# Patient Record
Sex: Male | Born: 1965 | Race: White | Hispanic: No | Marital: Married | Smoking: Never smoker
Health system: Southern US, Community
[De-identification: ages and names within clinical notes are randomized; demographics above are authoritative.]

## PROBLEM LIST (undated history)

## (undated) DIAGNOSIS — Z800 Family history of malignant neoplasm of digestive organs: Secondary | ICD-10-CM

## (undated) HISTORY — PX: APPENDECTOMY: SHX54

## (undated) HISTORY — PX: HEMORRHOID SURGERY: SHX153

## (undated) HISTORY — DX: Family history of malignant neoplasm of digestive organs: Z80.0

---

## 2016-07-12 ENCOUNTER — Emergency Department (INDEPENDENT_AMBULATORY_CARE_PROVIDER_SITE_OTHER): Payer: 59

## 2016-07-12 ENCOUNTER — Emergency Department
Admission: EM | Admit: 2016-07-12 | Discharge: 2016-07-12 | Disposition: A | Discharge status: Home / Self Care | Payer: 59 | Source: Home / Self Care | Attending: Family Medicine | Admitting: Family Medicine

## 2016-07-12 ENCOUNTER — Encounter: Payer: Self-pay | Admitting: Emergency Medicine

## 2016-07-12 DIAGNOSIS — S63501A Unspecified sprain of right wrist, initial encounter: Secondary | ICD-10-CM

## 2016-07-12 DIAGNOSIS — W009XXA Unspecified fall due to ice and snow, initial encounter: Secondary | ICD-10-CM | POA: Diagnosis not present

## 2016-07-12 DIAGNOSIS — S6991XA Unspecified injury of right wrist, hand and finger(s), initial encounter: Secondary | ICD-10-CM | POA: Diagnosis not present

## 2016-07-12 NOTE — Discharge Instructions (Signed)
Wear wrist splint for about 7 to 10 days until improved. Apply ice pack for 20 to 30 minutes, 3 to 4 times daily  Continue until pain and swelling decrease.  Begin Ibuprofen 200mg , 4 tabs every 8 hours with food.  Begin range of motion and stretching exercises as tolerated.

## 2016-07-12 NOTE — ED Triage Notes (Signed)
Pt fell today on some ice, having right hand/wrist pain from fall.

## 2016-07-12 NOTE — ED Provider Notes (Signed)
[facility]    CSN: [id number] Arrival date & time: 07/12/16  1340     History   Chief Complaint Chief Complaint  Patient presents with  . Hand Injury    HPI Richard English is a 51 y.o. male.   Patient slipped on ice in a parking lot one hour ago, bracing himself with his right hand.  He has had persistent pain in his right wrist.   The history is provided by the patient.  Wrist Pain  This is a new problem. The current episode started 1 to 2 hours ago. The problem occurs constantly. The problem has not changed since onset.Associated symptoms comments: none. Exacerbated by: flexion and extension of right wrist. Nothing relieves the symptoms. He has tried nothing for the symptoms.    History reviewed. No pertinent past medical history.  There are no active problems to display for this patient.   Past Surgical History:  Procedure Laterality Date  . APPENDECTOMY         Home Medications    Prior to Admission medications   Not on File    Family History Family History  Problem Relation Age of Onset  . Hypertension Mother     Social History Social History  Substance Use Topics  . Smoking status: Never Smoker  . Smokeless tobacco: Never Used  . Alcohol use Yes     Comment: 4 drinks weekly     Allergies   Sulfa antibiotics   Review of Systems Review of Systems  All other systems reviewed and are negative.    Physical Exam Triage Vital Signs ED Triage Vitals  Enc Vitals Group     BP 07/12/16 1402 138/84     Pulse Rate 07/12/16 1402 83     Resp --      Temp 07/12/16 1402 98.3 F (36.8 C)     Temp Source 07/12/16 1402 Oral     SpO2 07/12/16 1402 98 %     Weight 07/12/16 1402 191 lb 8 oz (86.9 kg)     Height 07/12/16 1402 6' (1.829 m)     Head Circumference --      Peak Flow --      Pain Score 07/12/16 1407 1     Pain Loc --      Pain Edu? --      Excl. in GC? --    No data found.   Updated Vital Signs BP 138/84 (BP  Location: Left Arm)   Pulse 83   Temp 98.3 F (36.8 C) (Oral)   Ht 6' (1.829 m)   Wt 191 lb 8 oz (86.9 kg)   SpO2 98%   BMI 25.97 kg/m   Visual Acuity Right Eye Distance:   Left Eye Distance:   Bilateral Distance:    Right Eye Near:   Left Eye Near:    Bilateral Near:     Physical Exam  Constitutional: He appears well-developed.  HENT:  Head: Atraumatic.  Right Ear: External ear normal.  Left Ear: External ear normal.  Nose: Nose normal.  Mouth/Throat: Oropharynx is clear and moist.  Eyes: Conjunctivae are normal. Pupils are equal, round, and reactive to light.  Neck: Normal range of motion.  Cardiovascular: Normal heart sounds.   Pulmonary/Chest: Breath sounds normal.  Abdominal: There is no tenderness.  Musculoskeletal:       Right wrist: He exhibits decreased range of motion, tenderness and bony tenderness.       Hands: Right wrist has tenderness  to palpation over the ulnar aspect as noted on diagram.   Neurological: He is alert.  Skin: Skin is warm and dry.  Nursing note and vitals reviewed.    UC Treatments / Results  Labs (all labs ordered are listed, but only abnormal results are displayed) Labs Reviewed - No data to display  EKG  EKG Interpretation None       Radiology Dg Wrist Complete Right  Result Date: 07/12/2016 CLINICAL DATA:  Fall on ice today with right hand and wrist injury. Pain localizing to region of fifth metacarpal radiating to wrist. Initial encounter. EXAM: RIGHT WRIST - COMPLETE 3+ VIEW COMPARISON:  None. FINDINGS: There is no evidence of fracture or dislocation. There is no evidence of arthropathy or other focal bone abnormality. Soft tissues are unremarkable. IMPRESSION: Negative. Electronically Signed   By: Irish LackGlenn  Yamagata M.D.   On: 07/12/2016 14:32   Dg Hand Complete Right  Result Date: 07/12/2016 CLINICAL DATA:  Fall on ice today with right hand and wrist injury. Pain localizing to region of fifth metacarpal radiating to  wrist. Initial encounter. EXAM: RIGHT HAND - COMPLETE 3+ VIEW COMPARISON:  None. FINDINGS: There is no evidence of fracture or dislocation. There is no evidence of arthropathy or other focal bone abnormality. Soft tissues are unremarkable. IMPRESSION: Negative. Electronically Signed   By: Irish LackGlenn  Yamagata M.D.   On: 07/12/2016 14:31    Procedures Procedures (including critical care time)  Medications Ordered in UC Medications - No data to display   Initial Impression / Assessment and Plan / UC Course  I have reviewed the triage vital signs and the nursing notes.  Pertinent labs & imaging results that were available during my care of the patient were reviewed by me and considered in my medical decision making (see chart for details).    Suspect TFCC injury.  Wrist splint applied. Wear wrist splint for about 7 to 10 days until improved. Apply ice pack for 20 to 30 minutes, 3 to 4 times daily  Continue until pain and swelling decrease.  Begin Ibuprofen 200mg , 4 tabs every 8 hours with food.  Begin range of motion and stretching exercises as tolerated. Followup with Dr. Rodney Langtonhomas Thekkekandam or Dr. Clementeen GrahamEvan Corey ([facility]) if not improving about two weeks.     Final Clinical Impressions(s) / UC Diagnoses   Final diagnoses:  Sprain of right wrist, initial encounter    New Prescriptions New Prescriptions   No medications on file     Lattie HawStephen A Jentzen Minasyan, MD 07/19/16 1918

## 2016-08-12 ENCOUNTER — Encounter: Payer: Self-pay | Admitting: Family Medicine

## 2016-08-12 ENCOUNTER — Ambulatory Visit (INDEPENDENT_AMBULATORY_CARE_PROVIDER_SITE_OTHER): Payer: 59 | Admitting: Family Medicine

## 2016-08-12 ENCOUNTER — Ambulatory Visit (INDEPENDENT_AMBULATORY_CARE_PROVIDER_SITE_OTHER): Payer: 59

## 2016-08-12 VITALS — BP 125/77 | HR 79 | Wt 195.0 lb

## 2016-08-12 DIAGNOSIS — M7042 Prepatellar bursitis, left knee: Secondary | ICD-10-CM

## 2016-08-12 DIAGNOSIS — M25562 Pain in left knee: Secondary | ICD-10-CM

## 2016-08-12 DIAGNOSIS — G8929 Other chronic pain: Secondary | ICD-10-CM | POA: Diagnosis not present

## 2016-08-12 DIAGNOSIS — S8992XA Unspecified injury of left lower leg, initial encounter: Secondary | ICD-10-CM | POA: Diagnosis not present

## 2016-08-12 DIAGNOSIS — M7040 Prepatellar bursitis, unspecified knee: Secondary | ICD-10-CM | POA: Insufficient documentation

## 2016-08-12 MED ORDER — DICLOFENAC SODIUM 1 % TD GEL: 4.0000 g | Freq: Four times a day (QID) | TRANSDERMAL | 11 refills | Status: AC

## 2016-08-12 NOTE — Progress Notes (Signed)
   Subjective:    I'm seeing this patient as a consultation for:  Rosalio MacadamiaHELMAN,STEVEN, MD   CC: Knee Pain  HPI: Jeb LeveringDavid Fell and landed on his left anterior knee about a month ago. He slipped and fell on the ice. He noted some pain and bruising initially which is mostly resolved. He was essentially pain-free except for when he kneels or puts pressure on the anterior knee. The pain can be quite severe at times. He denies any radiating pain weakness or numbness fevers or chills. He has not tried any medications aside from intermittent ibuprofen which helps some.  Past medical history, Surgical history, Family history not pertinant except as noted below, Social history, Allergies, and medications have been entered into the medical record, reviewed, and no changes needed.   Review of Systems: No headache, visual changes, nausea, vomiting, diarrhea, constipation, dizziness, abdominal pain, skin rash, fevers, chills, night sweats, weight loss, swollen lymph nodes, body aches, joint swelling, muscle aches, chest pain, shortness of breath, mood changes, visual or auditory hallucinations.   Objective:    Vitals:   08/12/16 1350  BP: 125/77  Pulse: 79   General: Well Developed, well nourished, and in no acute distress.  Neuro/Psych: Alert and oriented x3, extra-ocular muscles intact, able to move all 4 extremities, sensation grossly intact. Skin: Warm and dry, no rashes noted.  Respiratory: Not using accessory muscles, speaking in full sentences, trachea midline.  Cardiovascular: Pulses palpable, no extremity edema. Abdomen: Does not appear distended. MSK: Left knee: Unremarkable appearing without significant erythema or induration. He is tender to palpation at the inferior border of the patella. He is a palpable squeak in this area as well. He has pain-free and normal knee range of motion. He is intact extension and flexion strength. Stable ligamentous exam. Knee exam is otherwise unremarkable.  X-ray  left knee is normal-appearing awaiting formal radiology review  No results found for this or any previous visit (from the past 24 hour(s)). No results found.  Impression and Recommendations:    Assessment and Plan: 51 y.o. male with Posterior traumatic prepatellar bursitis of the left knee. Plan to treat conservatively with ice and diclofenac gel. Recheck in about a month if not better..   Discussed warning signs or symptoms. Please see discharge instructions. Patient expresses understanding.

## 2016-08-12 NOTE — Patient Instructions (Signed)
Thank you for coming in today. Apply the voltaren gel 4x daily.  You can also use up to 2 aleve twice daily for pain.  If you take aleve for more than a few day in a row take with pepcid, zantac or Prilosec to prevent ulcers.  The equivalent  dose of advil is 3-4 pills every 8 hours.    Prepatellar Bursitis Prepatellar bursitis is inflammation of the prepatellar bursa. The prepatellar bursa is a fluid-filled sac that cushions the kneecap (patella). Prepatellar bursitis happens when fluid builds up in the this sac and causes it to get larger. The condition causes knee pain. What are the causes? This condition may be caused by:  Constant pressure on the knees from kneeling.  A hit to the knee.  Falling on the knee.  A bacterial infection.  Moving the knee often in a forceful way. What increases the risk? This condition is more likely to develop in:  People who play a sport that involves a risk for falls on the knee or hard hits (blows) to the knee. These sports include:  Football.  Wrestling.  Basketball.  Soccer.  People who have to kneel for long periods of time, such as roofers, plumbers, and gardeners.  People with another inflammatory condition, such as gout or rheumatoid arthritis. What are the signs or symptoms? The most common symptom of this condition is knee pain that gets better with rest. Other symptoms include:  Swelling on the front of the kneecap.  Warmth in the knee.  Tenderness with activity.  Redness in the knee.  Inability to bend the knee or to kneel. How is this diagnosed? This condition may be diagnosed based on:  Your symptoms.  Your medical history.  A physical exam. During the exam, your provider will compare your knees and check for tenderness and pain when moving your knee. Your health care provider may also use a needle to remove fluid from the bursa to help diagnose an infection.  Tests, such as:  A blood test that checks for  infection.  X-rays. These may be taken to check the structure of the patella.  MRI or ultrasound. These may be done to check for swelling and fluid buildup in the bursa. How is this treated? This condition may be treated by:  Resting the knee.  Applying ice to the knee.  Medicine, such as:  Nonsteroidal anti-inflammatory drugs (NSAIDs). These can help to reduce pain and swelling.  Antibiotic medicines. These may be needed if you have an infection.  Steroid medicines. These may be prescribed if other treatments are not helping.  Raising (elevating) the knee while resting.  Doing strengthening and stretching exercises (physical therapy). These may be recommended after pain and swelling improve.  Having a procedure to remove fluid from the bursa. This may be done if other treatment is not helping.  Having surgery to remove the bursa. This may be done if you have a severe infection or if the condition keeps coming back after treatment. Follow these instructions at home: Medicines  Take over-the-counter and prescription medicines only as told by your health care provider.  If you were prescribed an antibiotic medicine, take it as told by your health care provider. Do not stop taking the antibiotic even if you start to feel better. Managing pain, stiffness, and swelling  If directed, apply ice to your knee.  Put ice in a plastic bag.  Place a towel between your skin and the bag.  Leave the ice  on for 20 minutes, 2-3 times a day.  Elevate your knee above the level of your heart while you are sitting or lying down. Driving  Do not drive or operate heavy machinery while taking prescription pain medicine.  Ask your health care provider when it is safe fpr you to drive. Activity  Rest your knee.  Avoid activities that cause pain.  Return to your normal activities as told by your health care provider. Ask your health care provider what activities are safe for you.  Do  exercises as told by your health care provider. General instructions  Do not use the injured limb to support your body weight until your health care provider says that you can.  Do not use any tobacco products, such as cigarettes, chewing tobacco, and e-cigarettes. Tobacco can delay bone healing. If you need help quitting, ask your health care provider.  Keep all follow-up visits as told by your health care provider. This is important. How is this prevented?  Warm up and stretch before being active.  Cool down and stretch after being active.  Give your body time to rest between periods of activity.  Make sure to use equipment that fits you.  Be safe and responsible while being active to avoid falls.  Do at least 150 minutes of moderate-intensity exercise each week, such as brisk walking or water aerobics.  Maintain physical fitness, including:  Strength.  Flexibility.  Cardiovascular fitness.  Endurance. Contact a health care provider if:  Your symptoms do not improve.  Your symptoms get worse.  Your symptoms keep coming back after treatment.  You develop a fever and have warmth, redness, and swelling over your knee. This information is not intended to replace advice given to you by your health care provider. Make sure you discuss any questions you have with your health care provider. Document Released: 06/09/2005 Document Revised: 02/12/2016 Document Reviewed: 03/09/2015 Elsevier Interactive Patient Education  2017 [employer]    Prepatellar Bursitis Rehab Ask your health care provider which exercises are safe for you. Do exercises exactly as told by your health care provider and adjust them as directed. It is normal to feel mild stretching, pulling, tightness, or discomfort as you do these exercises, but you should stop right away if you feel sudden pain or your pain gets worse.Do not begin these exercises until told by your health care provider. Stretching and  range of motion exercises These exercises warm up your muscles and joints and improve the movement and flexibility of your knee. These exercises also help to relieve pain, numbness, and tingling. Exercise A: Hamstring, standing 1. Stand with your __________ foot resting on a chair. Your __________ leg should be fully extended. 2. Arch your lower back slightly. 3. Leading with your chest, lean forward at the waist until you feel a gentle stretch in the back of your __________ knee or in your thigh. You should not need to lean far to feel a stretch. 4. Hold this position for __________ seconds. Repeat __________ times. Complete this stretch __________ times a day. Exercise B: Knee flexion, active heel slides 1. Lie on your back with both knees straight. If this causes back discomfort, bend your __________ knee so your foot is flat on the floor. 2. Slowly slide your __________ heel back toward your buttocks until you feel a gentle stretch in the front of your knee or thigh. 3. Hold this position for __________ seconds. 4. Slowly slide your __________ heel back to the starting position.  Repeat __________ times. Complete this stretch __________ times a day. Strengthening exercises These exercises build strength and endurance in your knee. Endurance is the ability to use your muscles for a long time, even after they get tired. Exercise C: Quadriceps, isometric 1. Lie on your back with your __________ leg extended and your __________ knee bent. 2. Slowly tense the muscles in the front of your __________ thigh. When you do this, you should see your kneecap slide up toward your hip or see increased dimpling just above the knee. This motion will push the back of your knee down toward the surface that is under it. 3. For __________ seconds, keep the muscle as tight as you can without increasing your pain. 4. Relax the muscles slowly and completely. Repeat __________ times. Complete this exercise __________  times a day. Exercise D: Straight leg raises (quadriceps) 1. Lie on your back with your __________ leg extended and your __________ knee bent. 2. Slowly tense the muscles in your __________ thigh. When you do this, you should see your kneecap slide up toward your hip or see increased dimpling just above the knee. 3. Keep these muscles tight as you raise your leg 4-6 inches (10-15 cm) off the floor. 4. Hold this position for __________ seconds. 5. Keep these muscles tense as you lower your leg slowly. 6. Relax your muscles slowly and completely. Repeat __________ times. Complete this exercise __________ times a day. Exercise E: Straight leg raises (hip extensors) 1. Lie on your abdomen on a bed or a firm surface. You can put a pillow under your hips if that is more comfortable. 2. Tense your buttock muscles and lift your __________ thigh. Your __________ knee can be bent or straight, but do not let your back arch. 3. Hold this position for __________ seconds. 4. Slowly lower your leg to the starting position. 5. Let your muscles relax completely. Repeat __________ times. Complete this exercise __________ times a day. This information is not intended to replace advice given to you by your health care provider. Make sure you discuss any questions you have with your health care provider. Document Released: 06/09/2005 Document Revised: 02/12/2016 Document Reviewed: 03/09/2015 Elsevier Interactive Patient Education  2017 [employer]

## 2016-08-14 NOTE — Progress Notes (Signed)
Note sent to pcp via epic.  

## 2016-09-05 ENCOUNTER — Encounter: Payer: Self-pay | Admitting: *Deleted

## 2016-09-05 ENCOUNTER — Emergency Department
Admission: EM | Admit: 2016-09-05 | Discharge: 2016-09-05 | Disposition: A | Discharge status: Home / Self Care | Payer: 59 | Source: Home / Self Care | Attending: Family Medicine | Admitting: Family Medicine

## 2016-09-05 DIAGNOSIS — J069 Acute upper respiratory infection, unspecified: Secondary | ICD-10-CM | POA: Diagnosis not present

## 2016-09-05 DIAGNOSIS — B9789 Other viral agents as the cause of diseases classified elsewhere: Secondary | ICD-10-CM | POA: Diagnosis not present

## 2016-09-05 MED ORDER — AZITHROMYCIN 250 MG PO TABS: ORAL_TABLET | ORAL | 0 refills | Status: AC

## 2016-09-05 MED ORDER — PREDNISONE 20 MG PO TABS: ORAL_TABLET | ORAL | 0 refills | Status: AC

## 2016-09-05 NOTE — Discharge Instructions (Signed)
Take plain guaifenesin (1200mg extended release tabs such as Mucinex) twice daily, with plenty of water, for cough and congestion.  May add Pseudoephedrine (30mg, one or two every 4 to 6 hours) for sinus congestion.  Get adequate rest.   May use Afrin nasal spray (or generic oxymetazoline) each morning for about 5 days and then discontinue.  Also recommend using saline nasal spray several times daily and saline nasal irrigation (AYR is a common brand).  Use Flonase nasal spray each morning after using Afrin nasal spray and saline nasal irrigation. Try warm salt water gargles for sore throat.  Stop all antihistamines for now, and other non-prescription cough/cold preparations. May take Delsym Cough Suppressant at bedtime for nighttime cough.  

## 2016-09-05 NOTE — ED Provider Notes (Signed)
[facility]    CSN: [id number] Arrival date & time: 09/05/16  1050     History   Chief Complaint Chief Complaint  Patient presents with  . Sinus Problem  . Nasal Congestion    HPI Richard English is a 51 y.o. male.   Patient developed mild cold-like symptoms about 12 days ago with sinus congestion, low grade fever, and scratchy throat.  Five days ago he developed tightness in his anterior chest, followed by a cough two days ago.  He has a history of seasonal rhinitis.                                                                                                                                                                                          The history is provided by the patient.    History reviewed. No pertinent past medical history.  Patient Active Problem List   Diagnosis Date Noted  . Prepatellar bursitis 08/12/2016    Past Surgical History:  Procedure Laterality Date  . APPENDECTOMY         Home Medications    Prior to Admission medications   Medication Sig Start Date End Date Taking? Authorizing Provider  azithromycin (ZITHROMAX Z-PAK) 250 MG tablet Take 2 tabs today; then begin one tab once daily for 4 more days. 09/05/16   Lattie Haw, MD  diclofenac sodium (VOLTAREN) 1 % GEL Apply 4 g topically 4 (four) times daily. To affected joint. 08/12/16   Rodolph Bong, MD  EPINEPHrine 0.3 mg/0.3 mL IJ SOAJ injection Inject into the muscle. 11/29/12   Historical Provider, MD  predniSONE (DELTASONE) 20 MG tablet Take one tab by mouth twice daily for 5 days, then one daily for 3 days. Take with food. 09/05/16   Lattie Haw, MD    Family History Family History  Problem Relation Age of Onset  . Hypertension Mother     Social History Social History  Substance Use Topics  . Smoking status: Never Smoker  . Smokeless tobacco: Never Used  . Alcohol use Yes     Comment: 4 drinks weekly     Allergies   Bee venom; Wasp venom; and Sulfa  antibiotics   Review of Systems Review of Systems + sore throat + cough No pleuritic pain No wheezing + nasal congestion + post-nasal drainage No sinus pain/pressure No itchy/red eyes ? earache No hemoptysis No SOB No fever, + chills No nausea No vomiting No abdominal pain No diarrhea No urinary symptoms No skin rash + fatigue No myalgias + headache Used OTC meds without relief   Physical Exam Triage Vital Signs ED Triage  Vitals  Enc Vitals Group     BP 09/05/16 1121 126/84     Pulse Rate 09/05/16 1121 (!) 104     Resp --      Temp 09/05/16 1121 99.1 F (37.3 C)     Temp Source 09/05/16 1121 Oral     SpO2 09/05/16 1121 98 %     Weight --      Height --      Head Circumference --      Peak Flow --      Pain Score 09/05/16 1122 3     Pain Loc --      Pain Edu? --      Excl. in GC? --    No data found.   Updated Vital Signs BP 126/84 (BP Location: Left Arm)   Pulse (!) 104   Temp 99.1 F (37.3 C) (Oral)   SpO2 98%   Visual Acuity Right Eye Distance:   Left Eye Distance:   Bilateral Distance:    Right Eye Near:   Left Eye Near:    Bilateral Near:     Physical Exam Nursing notes and Vital Signs reviewed. Appearance:  Patient appears stated age, and in no acute distress Eyes:  Pupils are equal, round, and reactive to light and accomodation.  Extraocular movement is intact.  Conjunctivae are not inflamed  Ears:  Canals normal.  Tympanic membranes normal.  Nose:  Congested turbinates.  No sinus tenderness.   Marland Kitchen  Pharynx:  Normal Neck:  Supple.  Tender enlarged posterior/lateral nodes are palpated bilaterally  Lungs:   Course breath sounds posteriorly.  Breath sounds are equal.  Moving air well. Heart:  Regular rate and rhythm without murmurs, rubs, or gallops.  Abdomen:  Nontender without masses or hepatosplenomegaly.  Bowel sounds are present.  No CVA or flank tenderness.  Extremities:  No edema.  Skin:  No rash present.    UC Treatments /  Results  Labs (all labs ordered are listed, but only abnormal results are displayed) Labs Reviewed - No data to display  EKG  EKG Interpretation None       Radiology No results found.  Procedures Procedures (including critical care time)  Medications Ordered in UC Medications - No data to display   Initial Impression / Assessment and Plan / UC Course  I have reviewed the triage vital signs and the nursing notes.  Pertinent labs & imaging results that were available during my care of the patient were reviewed by me and considered in my medical decision making (see chart for details).    Begin Z-pak for atypical coverage. With his history of seasonal rhinitis, begin prednisone burst/taper. Take plain guaifenesin (1200mg  extended release tabs such as Mucinex) twice daily, with plenty of water, for cough and congestion.  May add Pseudoephedrine (30mg , one or two every 4 to 6 hours) for sinus congestion.  Get adequate rest.   May use Afrin nasal spray (or generic oxymetazoline) each morning for about 5 days and then discontinue.  Also recommend using saline nasal spray several times daily and saline nasal irrigation (AYR is a common brand).  Use Flonase nasal spray each morning after using Afrin nasal spray and saline nasal irrigation. Try warm salt water gargles for sore throat.  Stop all antihistamines for now, and other non-prescription cough/cold preparations. May take Delsym Cough Suppressant at bedtime for nighttime cough.     Final Clinical Impressions(s) / UC Diagnoses   Final diagnoses:  Viral URI with  cough    New Prescriptions New Prescriptions   AZITHROMYCIN (ZITHROMAX Z-PAK) 250 MG TABLET    Take 2 tabs today; then begin one tab once daily for 4 more days.   PREDNISONE (DELTASONE) 20 MG TABLET    Take one tab by mouth twice daily for 5 days, then one daily for 3 days. Take with food.     Lattie HawStephen A Khamron Gellert, MD 09/05/16 1250

## 2016-09-05 NOTE — ED Triage Notes (Signed)
Patient reports having typical cold symptoms 1 1/2 weeks ago, they improved but now feels pressure behind his eyes, colored nasal drainage, ear pressure and low grade fever. Taken Dayquil and Alka seltzer cold otc.

## 2017-12-30 IMAGING — DX DG KNEE 1-2V*R*
3 series · 3 of 3 positions shown · non-contrast
Comparison: None.

CLINICAL DATA: Comparison right knee radiograph. Chronic left knee
pain after slipping on ice. Initial encounter.

EXAM:
RIGHT KNEE - 1-2 VIEW

[tunnel]
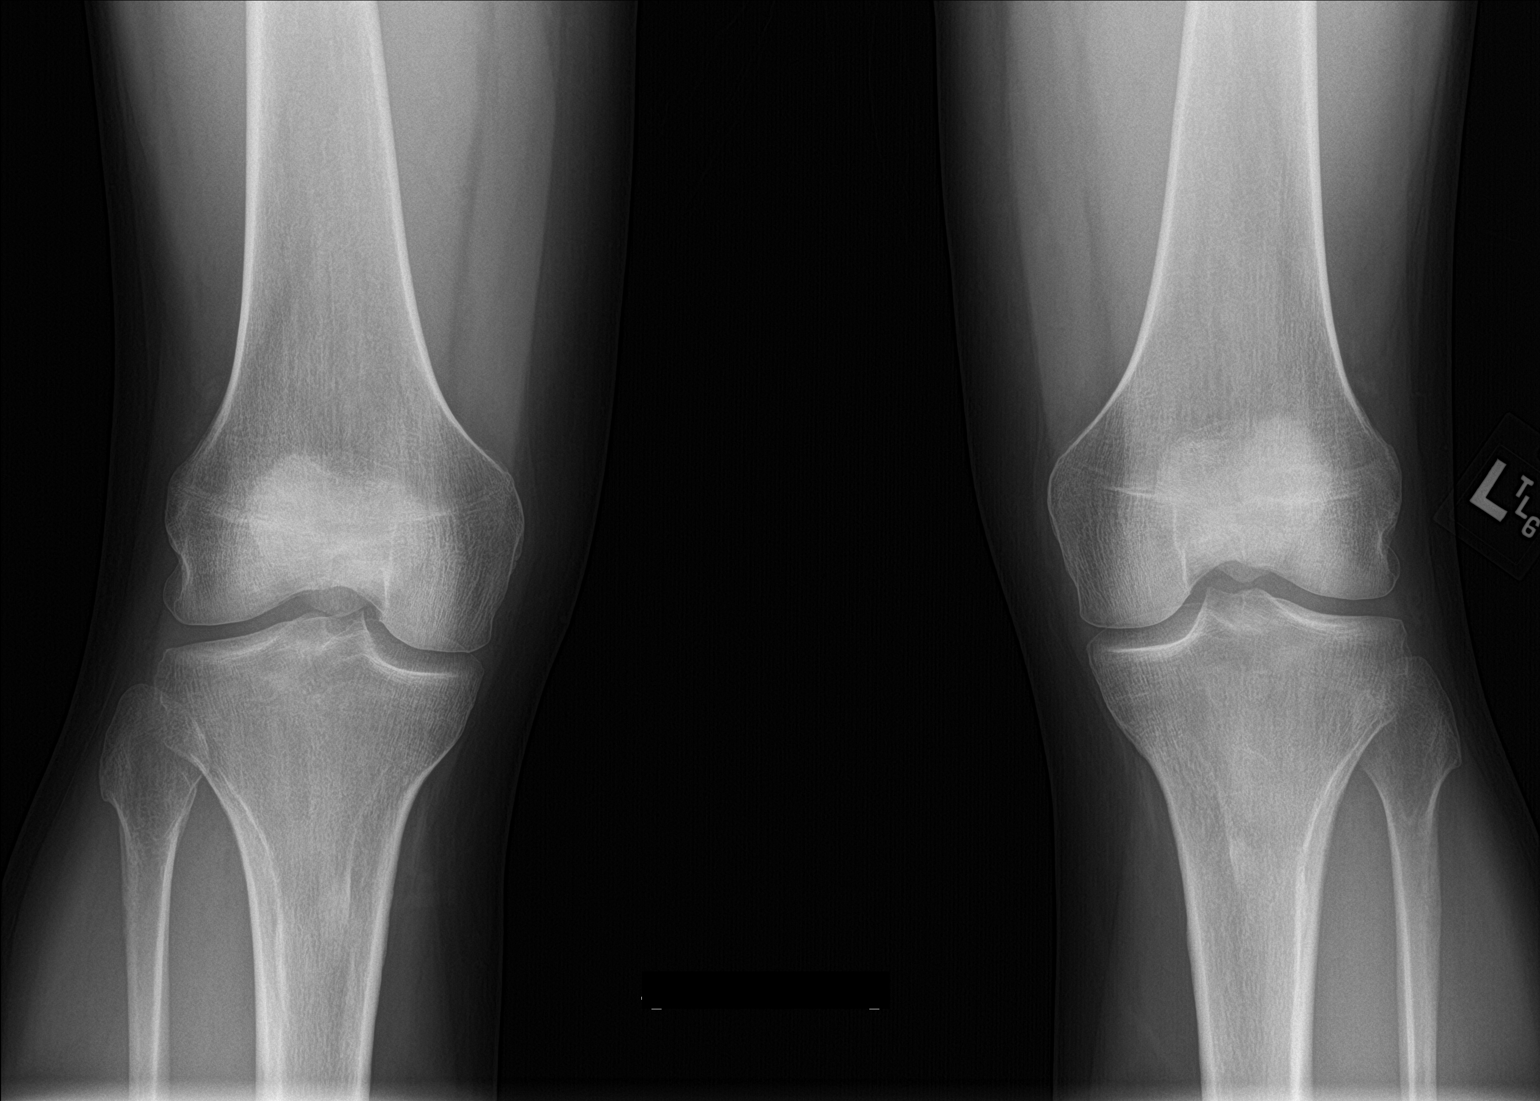

[knee lat]
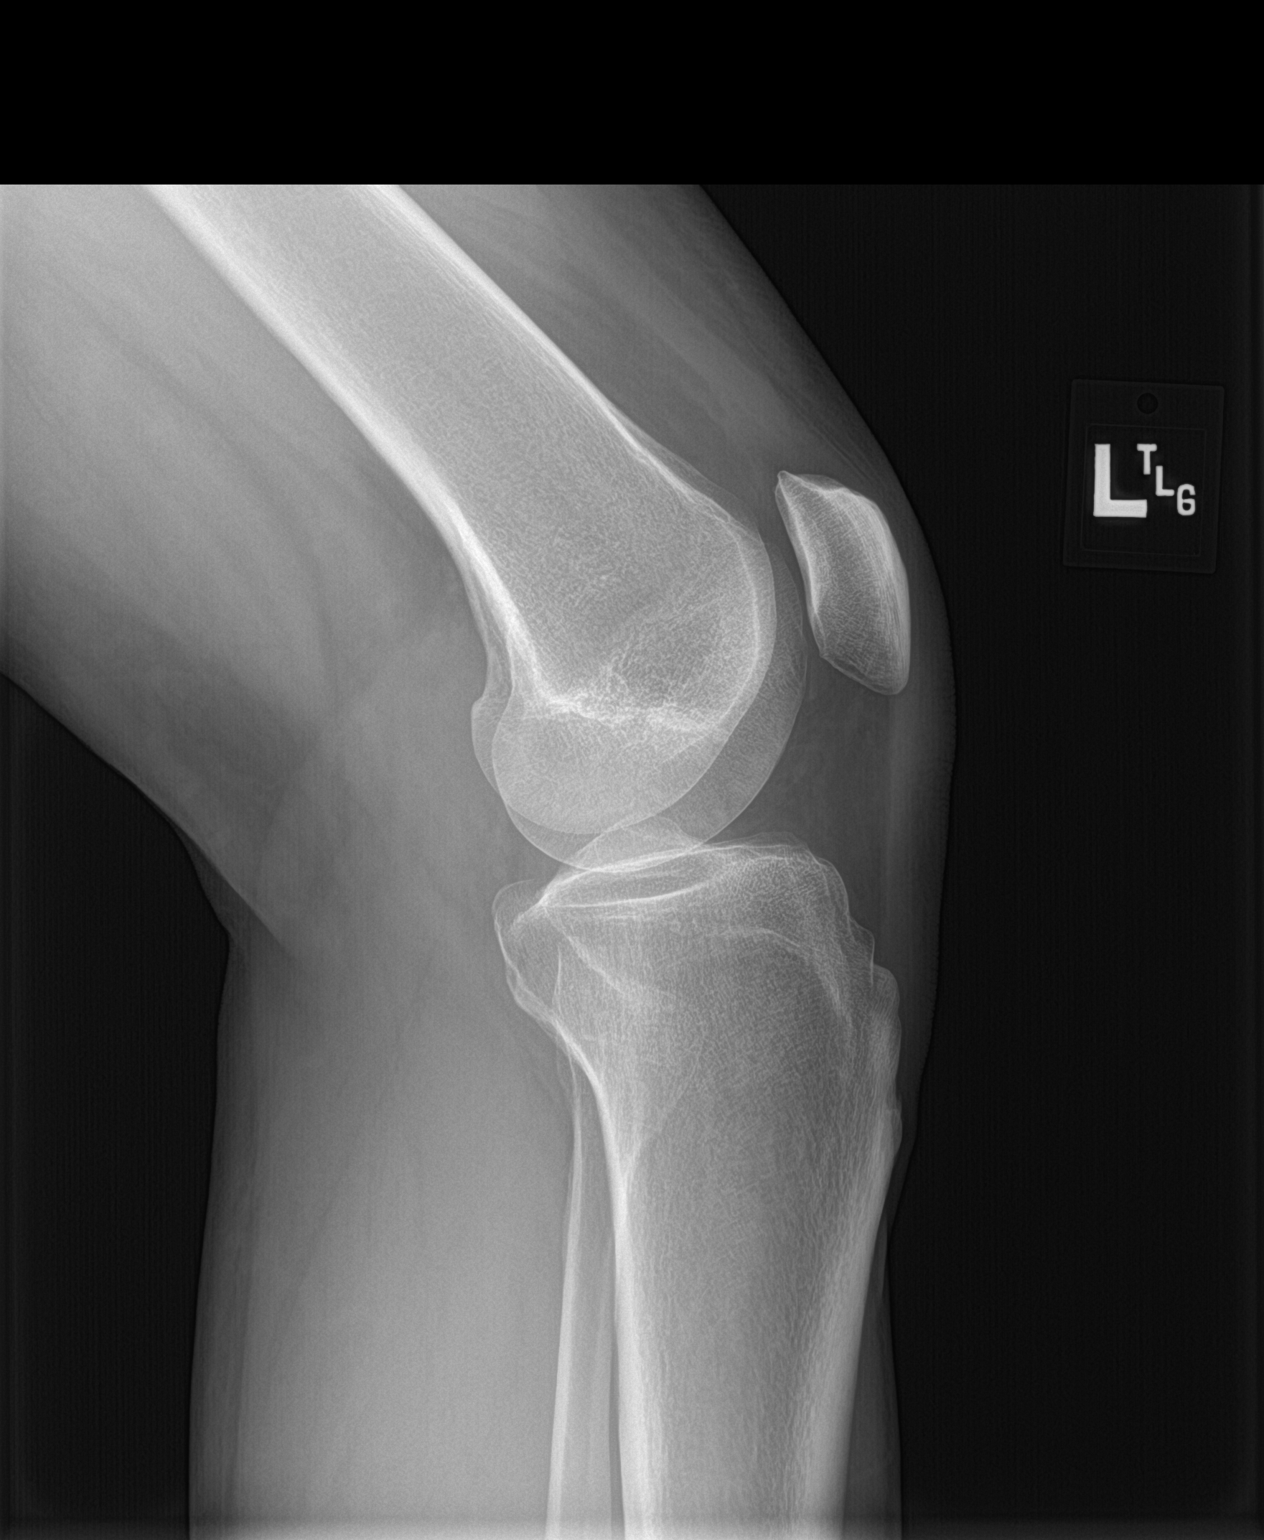

[knee sunrise]
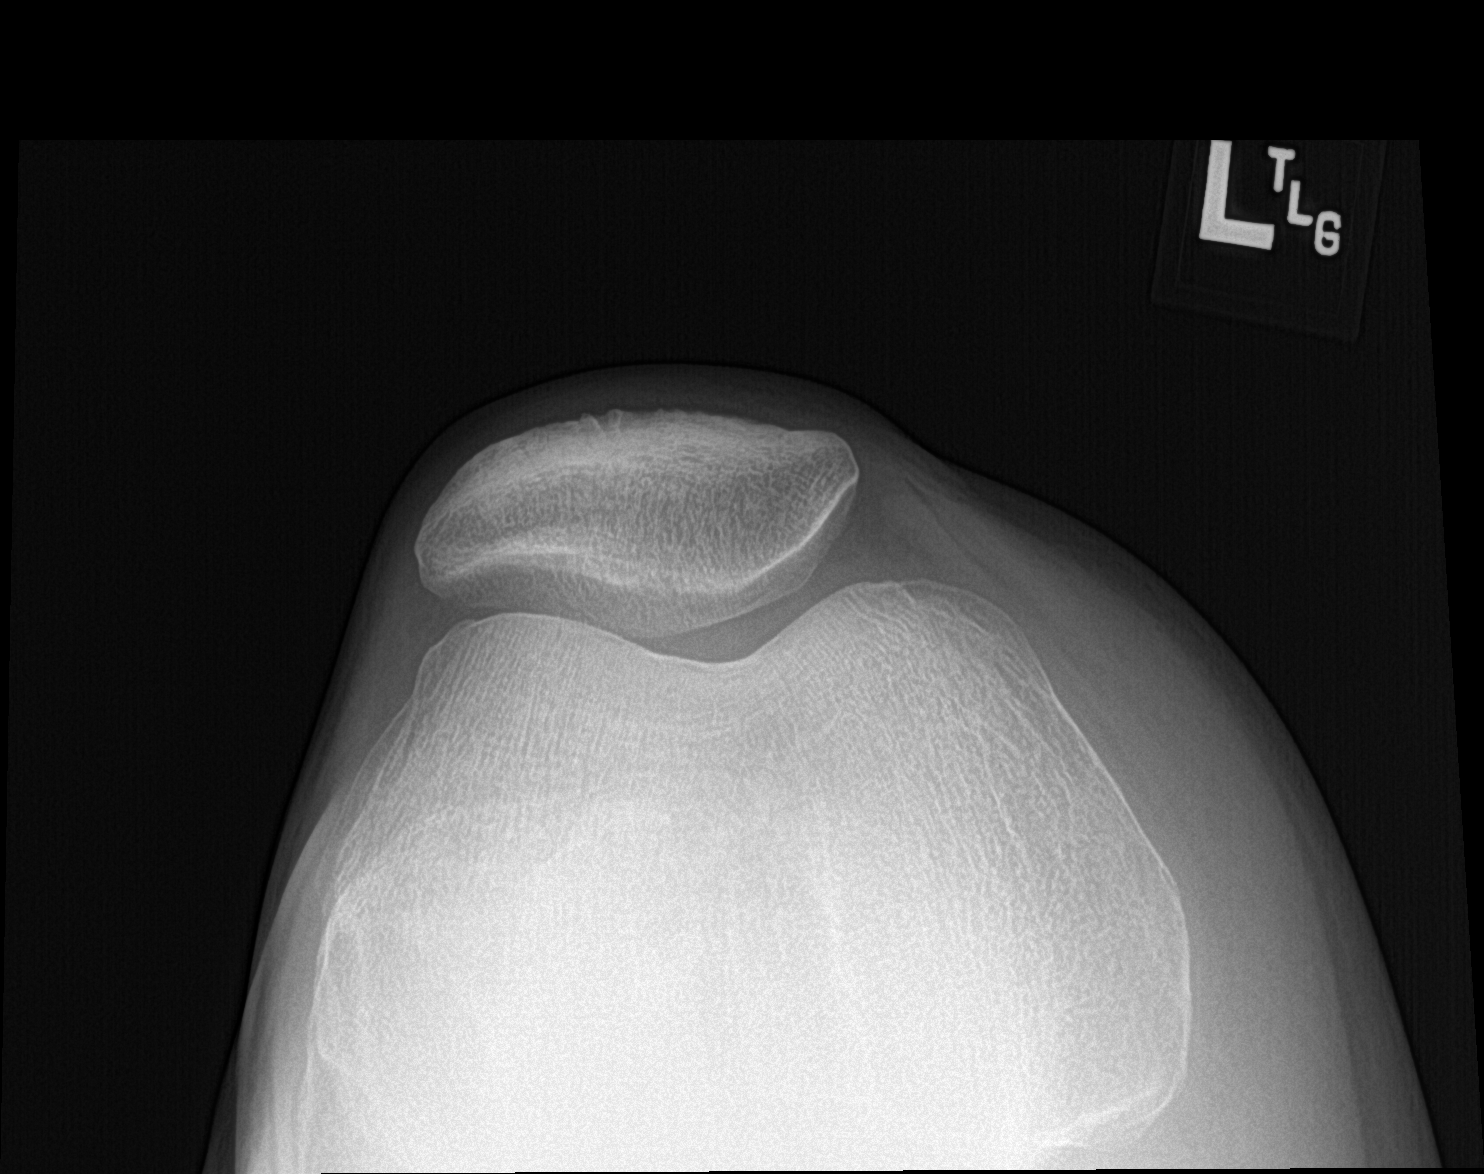

[3 of 3 positions shown; findings below may reference images not displayed]

FINDINGS: There is no evidence of fracture or dislocation. The joint spaces
are preserved. No significant degenerative change is seen; the
patellofemoral joint is grossly unremarkable in appearance.

The visualized soft tissues are normal in appearance.
IMPRESSION: No evidence of fracture or dislocation.

## 2024-04-11 ENCOUNTER — Inpatient Hospital Stay: Payer: Self-pay | Attending: Hematology

## 2024-04-11 ENCOUNTER — Inpatient Hospital Stay: Payer: Self-pay

## 2024-04-11 ENCOUNTER — Other Ambulatory Visit: Payer: Self-pay

## 2024-04-11 DIAGNOSIS — Z800 Family history of malignant neoplasm of digestive organs: Secondary | ICD-10-CM | POA: Diagnosis present

## 2024-04-11 DIAGNOSIS — Z1379 Encounter for other screening for genetic and chromosomal anomalies: Secondary | ICD-10-CM | POA: Diagnosis not present

## 2024-04-11 DIAGNOSIS — Z803 Family history of malignant neoplasm of breast: Secondary | ICD-10-CM | POA: Insufficient documentation

## 2024-04-11 LAB — GENETIC SCREENING ORDER

## 2024-04-11 NOTE — Progress Notes (Unsigned)
 REFERRING PROVIDER: Madeleine Standing, MD No address on file  PRIMARY PROVIDER:  Madeleine Standing, MD  PRIMARY REASON FOR VISIT:  1. Family history of colon cancer    HISTORY OF PRESENT ILLNESS:   Richard English, a 58 y.o. male, was seen for a Richard English cancer genetics consultation at the request of Richard Madeleine, MD due to a family history of colon cancer.  Richard English presents to clinic today to discuss the possibility of a hereditary predisposition to cancer, genetic testing, and to further clarify his future cancer risks, as well as potential cancer risks for family members.   CANCER HISTORY:  Richard English is a 58 y.o. male with no personal history of cancer.    RISK FACTORS:  Colonoscopy: Yes; On 03/18/2024, 02/21/2019 and 11/03/2013; 2 polyps found on 9/26 both non-cancerous   Any excessive radiation exposure or other environmental exposures the past: No Tobacco Use: Never Past Medical History:  Diagnosis Date   Family history of colon cancer    Past Surgical History:  Procedure Laterality Date   APPENDECTOMY     Social History   Socioeconomic History   Marital status: Married    Spouse name: Not on file   Number of children: Not on file   Years of education: Not on file   Highest education level: Not on file  Occupational History   Not on file  Tobacco Use   Smoking status: Never   Smokeless tobacco: Never  Substance and Sexual Activity   Alcohol use: Yes    Comment: 4 drinks weekly   Drug use: No   Sexual activity: Not on file  Other Topics Concern   Not on file  Social History Narrative   Not on file   Social Drivers of Health   Financial Resource Strain: Low Risk  (12/18/2023)   Received from [facility]   Overall Financial Resource Strain (CARDIA)    Difficulty of Paying Living Expenses: Not hard at all  Food Insecurity: No Food Insecurity (12/18/2023)   Received from [facility]   Hunger Vital Sign    Within the past 12 months, you worried that  your food would run out before you got the money to buy more.: Never true    Within the past 12 months, the food you bought just didn't last and you didn't have money to get more.: Never true  Transportation Needs: No Transportation Needs (12/18/2023)   Received from [facility] - Transportation    Lack of Transportation (Medical): No    Lack of Transportation (Non-Medical): No  Physical Activity: Insufficiently Active (12/18/2023)   Received from [facility]   Exercise Vital Sign    On average, how many days per week do you engage in moderate to strenuous exercise (like a brisk walk)?: 5 days    On average, how many minutes do you engage in exercise at this level?: 20 min  Stress: No Stress Concern Present (12/18/2023)   Received from [facility] - Occupational Stress Questionnaire    Feeling of Stress : Not at all  Social Connections: Moderately Integrated (12/18/2023)   Received from [facility]   Social Network    How would you rate your social network (family, work, friends)?: Adequate participation with social networks    FAMILY HISTORY:  We obtained a detailed, 4-generation family history pasted below.   Richard English is aware of relatives completing genetic testing for hereditary cancer risks.  He reported that his brother completed genetic testing and results were negative. He did not have a copy of his report.  There is reported Jewish ancestry unsure if Ashkenazi. He reported family history of [place].  There is no known consanguinity.  Significant diagnoses are listed below: Family History  Problem Relation Age of Onset   Colon cancer Father 55    GENETIC COUNSELING ASSESSMENT: Richard English is a 58 y.o. male with a family history of colon cancer which is somewhat suggestive of a hereditary cancer predisposition syndrome. We, therefore, discussed and recommended the following at today's visit.    DISCUSSION: We discussed that, in general, most cancer is not inherited in families, but instead is sporadic or familial. Sporadic cancers occur by chance and typically happen at older ages (>50 years) as this type of cancer is caused by genetic changes acquired during an individual's lifetime. Some families have more cancers than would be expected by chance; however, the ages or types of cancer are not consistent with a known genetic mutation or known genetic mutations have been ruled out. This type of familial cancer is thought to be due to a combination of multiple genetic, environmental, hormonal, and lifestyle factors. While this combination of factors likely increases the risk of cancer, the exact source of this risk is not currently identifiable or testable.  We discussed that 5-10% of cancer is the result of germline (heritable) genetic variants, with most cases associated with BRCA1/BRCA2. There are other genes that can be associated with hereditary cancer syndromes. These include MLH1, PMS2, MSH6, MSH2 all associated with Lynch Syndrome. We discussed that testing is beneficial for several reasons including knowing how to follow individuals after completing their treatment, identifying whether potential treatment and understanding if other family members could be at risk for cancer and allow them to undergo genetic testing.   We reviewed the characteristics, features and inheritance patterns of hereditary cancer syndromes. We also discussed genetic testing, including the appropriate family members to test, the process of testing, insurance coverage and turn-around-time for results. We discussed the implications of a negative, positive, carrier and/or variant of uncertain significant result. Richard English  was offered a common hereditary cancer panel (40 genes) and an expanded pan-cancer panel (77 genes). Richard English was informed of the benefits and limitations of each panel, including that expanded  pan-cancer panels contain genes that do not have clear management guidelines at this point in time.  We also discussed that as the number of genes included on a panel increases, the chances of variants of uncertain significance increases.  GENETIC TESTING NATIONAL CRITERIA: Based on Richard English family history of cancer he does not meets medical criteria for genetic testing based on the [employer] (NCCN) guidelines. We discussed that if his out of pocket cost for testing is over $100, the laboratory will call and confirm whether he wants to proceed with testing.  If the out of pocket cost of testing is less than $100 he will be billed by the genetic testing laboratory.   GENETIC TESTING CONSENT:  After considering the risks, benefits, and limitations, Richard English provided informed consent to pursue genetic testing. A blood sample was sent to [facility] for analysis of the CancerNext-Expanded+RNA Panel. Results should be available within approximately 2-3 weeks' time, at which point they will be disclosed by telephone to Richard English , as will any additional recommendations warranted by these results. Richard English will receive a summary of her genetic counseling  visit and a copy of his results once available. This information will also be available in Epic.  [facility] CancerNext-Expanded + RNAinsight gene panel which includes sequencing, rearrangement, and RNA analysis for the following 77 genes: AIP, ALK, APC, ATM, AXIN2, BAP1, BARD1, BMPR1A, BRCA1, BRCA2, BRIP1, CDC73, CDH1, CDK4, CDKN1B, CDKN2A, CEBPA, CHEK2, CTNNA1, DDX41, DICER1, ETV6, FH, FLCN, GATA2, LZTR1, MAX, MBD4, MEN1, MET, MLH1, MSH2, MSH3, MSH6, MUTYH, NF1, NF2, NTHL1, PALB2, PHOX2B, PMS2, POT1, PRKAR1A, PTCH1, PTEN, RAD51C, RAD51D, RB1, RET, RPS20, RUNX1, SDHA, SDHAF2, SDHB, SDHC, SDHD, SMAD4, SMARCA4, SMARCB1, SMARCE1, STK11, SUFU, TMEM127, TP53, TSC1, TSC2, VHL, and WT1 (sequencing and deletion/duplication); EGFR,  HOXB13, KIT, MITF, PDGFRA, POLD1, and POLE (sequencing only); EPCAM and GREM1 (deletion/duplication only).    GENETIC INFORMATION NONDISCRIMINATION ACT (GINA): We discussed that some people do not want to undergo genetic testing due to fear of genetic discrimination.  The Genetic Information Nondiscrimination Act (GINA) was signed into federal law in 2008. GINA prohibits health insurers and most employers from discriminating against individuals based on genetic information (including the results of genetic tests and family history information). According to GINA, health insurance companies cannot consider genetic information to be a preexisting condition, nor can they use it to make decisions regarding coverage or rates. GINA also makes it illegal for most employers to use genetic information in making decisions about hiring, firing, promotion, or terms of employment. It is important to note that GINA does not offer protections for life insurance, disability insurance, or long-term care insurance. GINA does not apply to those in the [employer], those who work for companies with less than 15 employees, and new life insurance or long-term disability insurance policies.  Health status due to a cancer diagnosis is not protected under GINA. More information about GINA can be found by visiting [employer]. Lastly, we encouraged Richard English to remain in contact with cancer genetics annually so that we can continuously update the family history and inform him of any changes in cancer genetics and testing that may be of benefit for this family.   Richard English's questions were answered to his satisfaction today. Our contact information was provided should additional questions or concerns arise. Thank you for the referral and allowing us  to share in the care of your patient.   Resources:  Johntavius Shepard was provided with the following:  [employer] Hereditary Cancer Testing  Patient Guide PLAN:  Testing Ordered:CancerNext-Expanded+RNA Panel. Clinic Note Faxed/Routed to Richard English PCP English Standing, MD   Santana Fryer, MS, [facility]  Certified Genetic Counselor  Email: [email or web address]  Phone: [phone]  I personally spent a total of 60 minutes in the care of the patient today including preparing to see the patient, counseling and educating, placing orders, referring and communicating with other health care professionals, and documenting clinical information in the EHR.  The patient was seen alone. Drs. Lanny Stalls, and/or Gudena were available for questions, if needed. _______________________________________________________________________ For Office Staff:  Number of people involved in session: 1 Was an Intern/ student involved with case: no

## 2024-04-29 ENCOUNTER — Telehealth: Payer: Self-pay | Admitting: Genetic Counselor

## 2024-04-29 ENCOUNTER — Ambulatory Visit: Payer: Self-pay | Admitting: Genetic Counselor

## 2024-04-29 DIAGNOSIS — Z1379 Encounter for other screening for genetic and chromosomal anomalies: Secondary | ICD-10-CM | POA: Insufficient documentation

## 2024-04-29 NOTE — Progress Notes (Signed)
 HPI:  Mr. Pargas was previously seen in the [facility] due to a family history of cancer and concerns regarding a hereditary predisposition to cancer. Please refer to our prior cancer genetics clinic note for more information regarding our discussion, assessment and recommendations, at the time. Mr. Poitra recent genetic test results were disclosed to him, as were recommendations warranted by these results. These results and recommendations are discussed in more detail below.  Results were disclosed via telephone on 04/29/24.   FAMILY HISTORY:  We obtained a detailed, 4-generation family history.  Significant diagnoses are listed below: Family History  Problem Relation Age of Onset   Hypertension Mother    Colon cancer Father 71   Heart attack Maternal Grandfather    Kidney cancer Maternal Grandmother    Arthritis/Rheumatoid Maternal Aunt    Arthritis/Rheumatoid Maternal Uncle     Family history by patient report:  Orvin Netter is aware of relatives completing genetic testing for hereditary cancer risks.  He reported that his brother completed genetic testing and results were negative. He did not have a copy of his report.  There is reported Jewish ancestry unsure if Ashkenazi. He reported family history of [place] Jewish Ancestry.  There is no known consanguinity.     GENETIC TEST RESULTS: Genetic testing reported out on 04/21/24 through the [facility] CanerNext-Expanded +RNA panel found no pathogenic mutations. The CancerNext-Expanded gene panel offered by [facility] and includes sequencing, rearrangement, and RNA analysis for the following 77 genes: AIP, ALK, APC, ATM, AXIN2, BAP1, BARD1, BMPR1A, BRCA1, BRCA2, BRIP1, CDC73, CDH1, CDK4, CDKN1B, CDKN2A, CEBPA, CHEK2, CTNNA1, DDX41, DICER1, EGFR, EPCAM, ETV6, FH, FLCN, GATA2, GREM1, HOXB13, KIT, LZTR1, MAX, MBD4, MEN1, MET, MITF, MLH1, MSH2, MSH3, MSH6, MUTYH, NF1, NF2, NTHL1, PALB2, PDGFRA, PHOX2B, PMS2, POLD1,  POLE, POT1, PRKAR1A, PTCH1, PTEN, RAD51C, RAD51D, RB1, RET, RPS20, RUNX1, SDHA, SDHAF2, SDHB, SDHC, SDHD, SMAD4, SMARCA4, SMARCB1, SMARCE1, STK11, SUFU, TMEM127, TP53, TSC1, TSC2, VHL, WT1. The test report has been scanned into EPIC and is located under the Molecular Pathology section of the Results Review tab.  A portion of the result report is included below for reference.     We discussed with Mr. Ress that because current genetic testing is not perfect, it is possible there may be a gene mutation in one of these genes that current testing cannot detect, but that chance is small.  We also discussed, that there could be another gene that has not yet been discovered, or that we have not yet tested, that is responsible for the cancer diagnoses in the family. It is also possible there is a hereditary cause for the cancer in the family that Mr. Mangiaracina did not inherit and therefore was not identified in his testing.  Therefore, it is important to remain in touch with cancer genetics in the future so that we can continue to offer Mr. Tout the most up to date genetic testing.   ADDITIONAL GENETIC TESTING: We discussed with Mr. Parada that his genetic testing was fairly extensive.  If there are genes identified to increase cancer risk that can be analyzed in the future, we would be happy to discuss and coordinate this testing at that time.    Mr. Feasel had questions regarding any genetic testing that could be utilized by individuals in the general population that could be valuable to their health. We discussed options for further testing could include [facility]'s 163 gene Risk Panel, that evaluates conditions like hereditary cancer, cardiovascular disease, metabolic  conditions and other actionable genetic conditions. We also discussed the option for the [facility] study. Will send more information on these options by email per patient request.   CANCER SCREENING RECOMMENDATIONS: Mr. Depaula test  result is considered negative (normal).  This means that we have not identified a hereditary cause for his family history of cancer at this time. Most cancers happen by chance and this negative test suggests that his family history of cancer may fall into this category.    Possible reasons for Mr. Wadel negative genetic test include:  1. There may be a gene mutation in one of these genes that current testing methods cannot detect but that chance is small.  2. There could be another gene that has not yet been discovered, or that we have not yet tested, that is responsible for the cancer diagnoses in the family.  3.  There may be no hereditary risk for cancer in the family. The cancers in Mr. Nedeau and/or his family may be sporadic/familial or due to other genetic and environmental factors. 4. It is also possible there is a hereditary cause for the cancer in the family that Mr. Burling did not inherit.  Therefore, it is recommended he continue to follow the cancer management and screening guidelines provided by his primary healthcare provider. An individual's cancer risk and medical management are not determined by genetic test results alone. Overall cancer risk assessment incorporates additional factors, including personal medical history, family history, and any available genetic information that may result in a personalized plan for cancer prevention and surveillance  RECOMMENDATIONS FOR FAMILY MEMBERS:  Individuals in this family might be at some increased risk of developing cancer, over the general population risk, simply due to the family history of cancer.  We recommended women in this family have a yearly mammogram beginning at age 85, or 8 years younger than the earliest onset of cancer, an annual clinical breast exam, and perform monthly breast self-exams. Women in this family should also have a gynecological exam as recommended by their primary provider. All family members should be referred  for colonoscopy starting at age 37, or 42 years younger than the earliest onset of cancer.   It is also possible there is a hereditary cause for the cancer in Mr. Bollman family that he did not inherit and therefore was not identified in him.  Family members may benefit from completing their own genetic testing.   We also discussed that Mr. Tetterton wife is reported to be diagnosed with Lynch syndrome based on genetic testing. We reviewed the 1/2 chance for his children to carry the same variant as his wife, which could impact their cancer risks and surveillance/prevention options. Encouraged the family to reach out to us  if they have other questions or if their children would be interested in learning more about their testing options.   FOLLOW-UP: Lastly, we discussed with Mr. Piech that cancer genetics is a rapidly advancing field and it is possible that new genetic tests will be appropriate for him and/or his family members in the future. We encouraged him to remain in contact with cancer genetics on an annual basis so we can update his personal and family histories and let him know of advances in cancer genetics that may benefit this family.   Mr. Slovacek requested results and summary note are sent to him via secure email.   Our contact number was provided. Mr. Lacher questions were answered to his satisfaction, and he knows he  is welcome to call us  at anytime with additional questions or concerns.   Burnard Ogren, MS, [facility] Licensed, [email or web address] [phone]

## 2024-04-29 NOTE — Telephone Encounter (Signed)
 Richard English unable to discuss results at this time. Will call back later today.

## 2024-04-29 NOTE — Telephone Encounter (Signed)
 I spoke to Richard English to review results of genetic testing, completed with [facility] CancerNext-Expanded +RNAinsight panel. Testing did not identify any variants known to increase the risk for cancer.  Discussed that we do not know why there is cancer in the family. It is possible that the cancers in history occurred by chance or due to environmental factors. It is possible there are family members that have a variant that he did not inherit. It is also possible there is a hereditary cause for cancer that cannot be identified with current technology or due to a gene that was not tested. It will be important for him to keep in contact with genetics to keep up with whether additional testing may be needed.  Richard English shared his wife had previously tested positive for Lynch syndrome. Encouraged family to contact us  if we can assist with genetic counseling and testing for their children. Richard English test specifically did not identify any variants in the genes associated with Lynch syndrome.   Requested results sent by secure email to address on file. Will include information about the GeneConnect study based on his interest in further genetic testing for health and wellness.   Please see counseling note for further detail on this result.

## 2024-06-24 NOTE — H&P (Signed)
 " HPI 59 year old male with several months history of hemorrhoidal disease.  He has developed a palpable hemorrhoid with moderate pain.  He refer initial partial improvement with medical management.  He has persisted with a palpable mass.  He is being referred to our office for further management.  He denies constipation.   Reviewed and updated this visit by provider: None      Past Medical History Past Medical History      Past Medical History:  Diagnosis Date   Allergy      minor seasonal   Allergy to bee sting     Hemorrhoids, unspecified hemorrhoid type 06/21/2024   Neuralgia      parasthetia   Prepatellar bursitis 08/12/2016   Seasonal allergies          Past Surgical History Past Surgical History       Past Surgical History:  Procedure Laterality Date   Appendectomy       Colonoscopy       Vasectomy            Allergies Bee, Sulfa drugs cross reactors, and Wasp venom   Medications [Current Medications]  [Current Medications]       Current Outpatient Medications  Medication Sig Dispense Refill   EPINEPHrine (EPIPEN 2-PAK) 0.3 mg/0.3 mL injection Inject 0.3 mLs (0.3 mg dose) into the muscle once as needed for Anaphylaxis for up to 1 dose. 1 each 0   hydrocortisone 2.5 % cream Apply to affected area 2 times daily 60 g 1    No current facility-administered medications for this visit.     Family History [Family History]  [Family History]       Problem Relation Name Age of Onset   Arthritis Maternal Aunt Terry             Rheumatoid      Maternal Uncle Tom             Rheumatoid   Cancer Father Royal Vandevoort             Colon Cancer age 62   Colon cancer Father Vicent Febles     Colon polyps Father Edmon Magid     GER disease Brother       Hypertension Mother Jonovan Boedecker     Stroke Mother Rusty Villella             minor     Social History: [Social History]   [Social History]      Socioeconomic History    Marital status: Married  Occupational History      Employer: [employer]  Tobacco Use   Smoking status: Never      Passive exposure: Never   Smokeless tobacco: Never  Vaping Use   Vaping status: Never Used  Substance and Sexual Activity   Alcohol use: Yes      Alcohol/week: 6.0 standard drinks of alcohol      Types: 6 Drinks containing 0.5 oz of alcohol per week   Drug use: No   Sexual activity: Yes      Partners: Female     Review of Systems  All other systems reviewed and are negative.  Objective:    Vitals     Vitals:    06/21/24 1305  BP: 120/76  Patient Position: Sitting  Pulse: 86  Temp: 97.8 F (36.6 C)  TempSrc: Temporal  Height: 6' (1.829 m)  Weight: 184 lb 12.8 oz (83.8 kg)  BMI (Calculated):  25.1  PainSc: 0-No pain  PainLoc: Rectum      Physical Exam Vitals and nursing note reviewed.  Constitutional:      General: He is not in acute distress.    Appearance: He is well-developed. He is not toxic-appearing.  Neck:     Thyroid: No thyroid mass.  Cardiovascular:     Rate and Rhythm: Normal rate and regular rhythm.     Pulses:          Radial pulses are 2+ on the right side and 2+ on the left side.     Heart sounds: Normal heart sounds.  Pulmonary:     Effort: Pulmonary effort is normal. No tachypnea or respiratory distress.     Breath sounds: Normal breath sounds.  Abdominal:     General: There is no distension.     Palpations: Abdomen is soft. Abdomen is not rigid. There is no mass.     Tenderness: There is no abdominal tenderness. There is no guarding or rebound.     Hernia: No hernia is present. There is no hernia in the left inguinal area.  Genitourinary:    Comments: On rectal exam, on inspection there is a distended external hemorrhoid over the left posterior quadrant.  There is mild tenderness to palpation.  There is a small distended external hemorrhoid over the right lateral quadrant.  On digital exam, no masses palpated.   On anoscopy, there is a distended internal hemorrhoid over the left posterior quadrant that extends down to the external hemorrhoid.  There is a mildly distended internal hemorrhoid over the right lateral quadrant.  There is no evidence of bleeding. Lymphadenopathy:     Upper Body:     Right upper body: No supraclavicular adenopathy.     Left upper body: No supraclavicular adenopathy.  Skin:    General: Skin is warm and dry.  Neurological:     Mental Status: He is alert and oriented to person, place, and time. He is not disoriented.  Psychiatric:        Behavior: Behavior normal.        Thought Content: Thought content normal.        Judgment: Judgment normal.          Assessment / Plan:    Assessment 1. Hemorrhoids, unspecified hemorrhoid type -     Ambulatory referral to General Surgery -     Case request operating room: HEMORRHOIDECTOMY SIMPLE; Future     Plan   I have recommended to proceed with exam under anesthesia and hemorrhoidectomy.  Risks include but limited to bleeding, infection, recurrence and pain have been explained to the patient.  He understands and agrees to proceed.   Risks, benefits, and alternatives of the medications and treatment plan prescribed today were discussed, and patient expressed understanding. Plan follow-up as discussed or as needed if any worsening symptoms or change in condition.                     Electronically signed by Christell VEAR Mater, MD  "

## 2024-06-26 ENCOUNTER — Other Ambulatory Visit: Payer: Self-pay

## 2024-06-26 ENCOUNTER — Emergency Department (HOSPITAL_COMMUNITY)

## 2024-06-26 ENCOUNTER — Emergency Department (HOSPITAL_COMMUNITY)
Admission: EM | Admit: 2024-06-26 | Discharge: 2024-06-27 | Disposition: A | Discharge status: Home / Self Care | Attending: Emergency Medicine | Admitting: Emergency Medicine

## 2024-06-26 ENCOUNTER — Encounter (HOSPITAL_COMMUNITY): Payer: Self-pay

## 2024-06-26 DIAGNOSIS — K5641 Fecal impaction: Secondary | ICD-10-CM | POA: Insufficient documentation

## 2024-06-26 DIAGNOSIS — R109 Unspecified abdominal pain: Secondary | ICD-10-CM | POA: Diagnosis present

## 2024-06-26 DIAGNOSIS — R339 Retention of urine, unspecified: Secondary | ICD-10-CM | POA: Diagnosis not present

## 2024-06-26 DIAGNOSIS — E871 Hypo-osmolality and hyponatremia: Secondary | ICD-10-CM | POA: Diagnosis not present

## 2024-06-26 LAB — COMPREHENSIVE METABOLIC PANEL WITH GFR
ALT: 21 U/L (ref 0–44)
AST: 24 U/L (ref 15–41)
Albumin: 4.4 g/dL (ref 3.5–5.0)
Alkaline Phosphatase: 70 U/L (ref 38–126)
Anion gap: 12 (ref 5–15)
BUN: 12 mg/dL (ref 6–20)
CO2: 26 mmol/L (ref 22–32)
Calcium: 8.9 mg/dL (ref 8.9–10.3)
Chloride: 92 mmol/L — ABNORMAL LOW (ref 98–111)
Creatinine, Ser: 0.99 mg/dL (ref 0.61–1.24)
GFR, Estimated: >60 mL/min
Glucose, Bld: 126 mg/dL — ABNORMAL HIGH (ref 70–99)
Potassium: 3.7 mmol/L (ref 3.5–5.1)
Sodium: 130 mmol/L — ABNORMAL LOW (ref 135–145)
Total Bilirubin: 1 mg/dL (ref 0.0–1.2)
Total Protein: 7.2 g/dL (ref 6.5–8.1)

## 2024-06-26 LAB — CBC WITH DIFFERENTIAL/PLATELET
Abs Immature Granulocytes: 0.05 K/uL (ref 0.00–0.07)
Basophils Absolute: 0.1 K/uL (ref 0.0–0.1)
Basophils Relative: 0 %
Eosinophils Absolute: 0.1 K/uL (ref 0.0–0.5)
Eosinophils Relative: 1 %
HCT: 41.9 % (ref 39.0–52.0)
Hemoglobin: 15 g/dL (ref 13.0–17.0)
Immature Granulocytes: 0 %
Lymphocytes Relative: 18 %
Lymphs Abs: 2.3 K/uL (ref 0.7–4.0)
MCH: 31.6 pg (ref 26.0–34.0)
MCHC: 35.8 g/dL (ref 30.0–36.0)
MCV: 88.4 fL (ref 80.0–100.0)
Monocytes Absolute: 1.2 K/uL — ABNORMAL HIGH (ref 0.1–1.0)
Monocytes Relative: 9 %
Neutro Abs: 8.9 K/uL — ABNORMAL HIGH (ref 1.7–7.7)
Neutrophils Relative %: 72 %
Platelets: 278 K/uL (ref 150–400)
RBC: 4.74 MIL/uL (ref 4.22–5.81)
RDW: 12.3 % (ref 11.5–15.5)
WBC: 12.6 K/uL — ABNORMAL HIGH (ref 4.0–10.5)
nRBC: 0 % (ref 0.0–0.2)

## 2024-06-26 MED ORDER — IOHEXOL 300 MG/ML  SOLN
100.0000 mL | Freq: Once | INTRAMUSCULAR | Status: AC | PRN
  Administered 2024-06-26: 100 mL via INTRAVENOUS

## 2024-06-26 MED ORDER — HYDROMORPHONE HCL 1 MG/ML IJ SOLN
1.0000 mg | Freq: Once | INTRAMUSCULAR | Status: AC
  Administered 2024-06-26: 1 mg via INTRAVENOUS
  Filled 2024-06-26: qty 1

## 2024-06-26 MED ORDER — HYDROMORPHONE HCL 1 MG/ML IJ SOLN
0.5000 mg | Freq: Once | INTRAMUSCULAR | Status: AC
  Administered 2024-06-26: 0.5 mg via INTRAVENOUS
  Filled 2024-06-26: qty 1

## 2024-06-26 MED ORDER — ONDANSETRON HCL 4 MG/2ML IJ SOLN
4.0000 mg | Freq: Once | INTRAMUSCULAR | Status: AC
  Administered 2024-06-26: 4 mg via INTRAVENOUS
  Filled 2024-06-26: qty 2

## 2024-06-26 NOTE — ED Notes (Signed)
 Visitor came out of room stating pt feels a lot of pressure. Bladder scan: 302 mL

## 2024-06-26 NOTE — Telephone Encounter (Signed)
 Reason for Disposition  [1] MILD-MODERATE abdomen pain AND [2] constant AND [3] present > 2 hours  Protocols used: Abdomen Bloating and Swelling-A-AH   Pt c/o 8/10 abdominal pain, inability to flatulate or pass stool.  States he is unable to urinate unless water is running over him in the shower.  States he has drank mag citrate, miralax, and taken ducolax without relief.  RN contacts OCP.  Dr. Kingston advises:  add 600mg  of ibuprofen q6 hours, drink extra water, increase ambulation, if unable to urinate without assistance in 6-7 hours he should go to ED.  Rn relays Dr. Kingston' advice to the caller.  Caller states pt just advised her that he wants to go to ED, urinating has become more difficult.

## 2024-06-26 NOTE — ED Notes (Signed)
 Patient is resting comfortably.

## 2024-06-26 NOTE — ED Provider Notes (Signed)
 " [place] EMERGENCY DEPARTMENT AT [facility] Provider Note   CSN: [id number] Arrival date & time: 06/26/24  2103     Patient presents with: Constipation   Richard English is a 58 y.o. male who is 48 hours postop from hemorrhoidectomy with Dr. Randye who presents withyes at [facility] no bowel movement since discharge, no longer passing flatus, urinary retention, and severe cramping abdominal pain.  Patient visibly uncomfortable, groaning and writhing around in the bed.  Denies any pain at his surgical site, denies any penile or testicular pain.  States he is only able to urinate once this morning and then a small amount this evening in the shower.  No history of similar symptoms in the past.  {Add pertinent medical, surgical, social history, OB history to HPI:32947} HPI     Prior to Admission medications  Medication Sig Start Date End Date Taking? Authorizing Provider  azithromycin  (ZITHROMAX  Z-PAK) 250 MG tablet Take 2 tabs today; then begin one tab once daily for 4 more days. 09/05/16   Pauline Garnette LABOR, MD  diclofenac  sodium (VOLTAREN ) 1 % GEL Apply 4 g topically 4 (four) times daily. To affected joint. 08/12/16   Corey, Evan S, MD  EPINEPHrine 0.3 mg/0.3 mL IJ SOAJ injection Inject into the muscle. 11/29/12   [provider]  predniSONE  (DELTASONE ) 20 MG tablet Take one tab by mouth twice daily for 5 days, then one daily for 3 days. Take with food. 09/05/16   Pauline Garnette LABOR, MD    Allergies: Bee venom, Wasp venom, and Sulfa antibiotics    Review of Systems  Constitutional:  Positive for appetite change. Negative for fatigue and fever.  HENT: Negative.    Cardiovascular: Negative.   Gastrointestinal:  Positive for abdominal distention, abdominal pain and constipation. Negative for nausea and vomiting.  Genitourinary:  Positive for difficulty urinating.    Updated Vital Signs BP (!) 146/103   Pulse 85   Temp 97.7 F (36.5 C) (Oral)   Resp 19   SpO2 100%   Physical  Exam Vitals and nursing note reviewed.  Constitutional:      General: He is in acute distress (abdominal pain).     Appearance: He is not toxic-appearing.  HENT:     Head: Normocephalic and atraumatic.     Mouth/Throat:     Mouth: Mucous membranes are moist.     Pharynx: No oropharyngeal exudate or posterior oropharyngeal erythema.  Eyes:     General:        Right eye: No discharge.        Left eye: No discharge.     Conjunctiva/sclera: Conjunctivae normal.     Pupils: Pupils are equal, round, and reactive to light.  Cardiovascular:     Rate and Rhythm: Normal rate and regular rhythm.     Pulses: Normal pulses.     Heart sounds: Normal heart sounds. No murmur heard. Pulmonary:     Effort: Pulmonary effort is normal. No respiratory distress.     Breath sounds: Normal breath sounds. No wheezing or rales.  Abdominal:     General: Bowel sounds are normal. There is distension.     Palpations: Abdomen is soft.     Tenderness: There is generalized abdominal tenderness. There is no right CVA tenderness or left CVA tenderness.  Musculoskeletal:        General: No deformity.     Cervical back: Neck supple.  Skin:    General: Skin is warm and dry.  Capillary Refill: Capillary refill takes less than 2 seconds.  Neurological:     General: No focal deficit present.     Mental Status: He is alert and oriented to person, place, and time. Mental status is at baseline.  Psychiatric:        Mood and Affect: Mood normal.     (all labs ordered are listed, but only abnormal results are displayed) Labs Reviewed  COMPREHENSIVE METABOLIC PANEL WITH GFR - Abnormal; Notable for the following components:      Result Value   Sodium 130 (*)    Chloride 92 (*)    Glucose, Bld 126 (*)    All other components within normal limits  CBC WITH DIFFERENTIAL/PLATELET - Abnormal; Notable for the following components:   WBC 12.6 (*)    Neutro Abs 8.9 (*)    Monocytes Absolute 1.2 (*)    All other  components within normal limits    EKG: None  Radiology: No results found.  {Document cardiac monitor, telemetry assessment procedure when appropriate:32947} Procedures   Medications Ordered in the ED  HYDROmorphone  (DILAUDID ) injection 1 mg (has no administration in time range)  ondansetron  (ZOFRAN ) injection 4 mg (has no administration in time range)      {Click here for ABCD2, HEART and other calculators REFRESH Note before signing:1}                              Medical Decision Making 58 year old male presents 48 hours postop from hemorrhoidectomy with concern for inability to void urine or pass flatus/have BM.  Hypertensive on intake, visibly uncomfortable, writhing in the bed groaning with abdominal distention and tenderness palpation generally.  GU exam deferred initially secondary to patient's pain and concern for acute urinary retention warranting catheterization.  Will return for examination of surgical bed once patient is more comfortable and bladder has been drained.  CT ordered.  Amount and/or Complexity of Data Reviewed Labs:     Details: CBC with 0.6.  CMP with mild hyponatremia of 130, normal renal and hepatic function. Radiology: ordered.  Risk Prescription drug management.   ***  {Document critical care time when appropriate  Document review of labs and clinical decision tools ie CHADS2VASC2, etc  Document your independent review of radiology images and any outside records  Document your discussion with family members, caretakers and with consultants  Document social determinants of health affecting pt's care  Document your decision making why or why not admission, treatments were needed:32947:::1}   Final diagnoses:  None    ED Discharge Orders     None        "

## 2024-06-26 NOTE — ED Triage Notes (Signed)
 Pt had hemorrhoidectomy on 06/24/24 and has not had a bowel movement since, despite multiple OTC therapies. Pt has 8/10 abdominal pain and discomfort that is described as cramping and pressure.

## 2024-06-27 MED ORDER — ONDANSETRON HCL 4 MG/2ML IJ SOLN
4.0000 mg | Freq: Once | INTRAMUSCULAR | Status: AC
  Administered 2024-06-27: 4 mg via INTRAVENOUS
  Filled 2024-06-27: qty 2

## 2024-06-27 MED ORDER — HYDROMORPHONE HCL 1 MG/ML IJ SOLN
1.0000 mg | Freq: Once | INTRAMUSCULAR | Status: AC
  Administered 2024-06-27: 1 mg via INTRAVENOUS
  Filled 2024-06-27: qty 1

## 2024-06-27 MED ORDER — LIDOCAINE HCL URETHRAL/MUCOSAL 2 % EX GEL
1.0000 | Freq: Once | CUTANEOUS | Status: AC
  Administered 2024-06-27: 1
  Filled 2024-06-27: qty 11

## 2024-06-27 MED ORDER — METOCLOPRAMIDE HCL 5 MG/ML IJ SOLN
10.0000 mg | Freq: Once | INTRAMUSCULAR | Status: AC
  Administered 2024-06-27: 10 mg via INTRAVENOUS
  Filled 2024-06-27: qty 2

## 2024-06-27 MED ORDER — FLEET ENEMA RE ENEM
1.0000 | ENEMA | Freq: Once | RECTAL | Status: AC
  Administered 2024-06-27: 1 via RECTAL
  Filled 2024-06-27: qty 1

## 2024-06-27 NOTE — Discharge Instructions (Addendum)
 You were seen in the ER today for your abdominal pain. You were successfully able to pass your impaction while in the ER . Please continue using your stool softeners as discussed and follow up with your surgeon as previously scheduled. Return to the ER with any new severe symptoms.
